# Patient Record
Sex: Female | Born: 1960 | Race: White | Hispanic: No | Smoking: Never smoker
Health system: Southern US, Community
[De-identification: ages and names within clinical notes are randomized; demographics above are authoritative.]

## PROBLEM LIST (undated history)

## (undated) DIAGNOSIS — F32A Depression, unspecified: Secondary | ICD-10-CM

## (undated) DIAGNOSIS — I10 Essential (primary) hypertension: Secondary | ICD-10-CM

## (undated) DIAGNOSIS — E079 Disorder of thyroid, unspecified: Secondary | ICD-10-CM

## (undated) DIAGNOSIS — F329 Major depressive disorder, single episode, unspecified: Secondary | ICD-10-CM

## (undated) HISTORY — PX: ABDOMINAL HYSTERECTOMY: SHX81

## (undated) HISTORY — PX: MENISCUS REPAIR: SHX5179

---

## 2015-01-01 ENCOUNTER — Emergency Department (HOSPITAL_COMMUNITY)
Admission: EM | Admit: 2015-01-01 | Discharge: 2015-01-01 | Disposition: A | Discharge status: Home / Self Care | Payer: Managed Care, Other (non HMO) | Attending: Emergency Medicine | Admitting: Emergency Medicine

## 2015-01-01 ENCOUNTER — Encounter (HOSPITAL_COMMUNITY): Payer: Self-pay | Admitting: Emergency Medicine

## 2015-01-01 ENCOUNTER — Emergency Department (HOSPITAL_COMMUNITY): Payer: Managed Care, Other (non HMO)

## 2015-01-01 DIAGNOSIS — I10 Essential (primary) hypertension: Secondary | ICD-10-CM | POA: Insufficient documentation

## 2015-01-01 DIAGNOSIS — F329 Major depressive disorder, single episode, unspecified: Secondary | ICD-10-CM | POA: Diagnosis not present

## 2015-01-01 DIAGNOSIS — Z79899 Other long term (current) drug therapy: Secondary | ICD-10-CM | POA: Diagnosis not present

## 2015-01-01 DIAGNOSIS — R002 Palpitations: Secondary | ICD-10-CM | POA: Diagnosis not present

## 2015-01-01 DIAGNOSIS — E079 Disorder of thyroid, unspecified: Secondary | ICD-10-CM | POA: Insufficient documentation

## 2015-01-01 HISTORY — DX: Depression, unspecified: F32.A

## 2015-01-01 HISTORY — DX: Disorder of thyroid, unspecified: E07.9

## 2015-01-01 HISTORY — DX: Essential (primary) hypertension: I10

## 2015-01-01 HISTORY — DX: Major depressive disorder, single episode, unspecified: F32.9

## 2015-01-01 LAB — CBC WITH DIFFERENTIAL/PLATELET
BASOS ABS: 0 10*3/uL (ref 0.0–0.1)
Basophils Relative: 1 % (ref 0–1)
EOS ABS: 0.4 10*3/uL (ref 0.0–0.7)
EOS PCT: 5 % (ref 0–5)
HCT: 37.6 % (ref 36.0–46.0)
HEMOGLOBIN: 12.4 g/dL (ref 12.0–15.0)
LYMPHS ABS: 1.8 10*3/uL (ref 0.7–4.0)
Lymphocytes Relative: 20 % (ref 12–46)
MCH: 28.7 pg (ref 26.0–34.0)
MCHC: 33 g/dL (ref 30.0–36.0)
MCV: 87 fL (ref 78.0–100.0)
Monocytes Absolute: 0.8 10*3/uL (ref 0.1–1.0)
Monocytes Relative: 9 % (ref 3–12)
Neutro Abs: 5.7 10*3/uL (ref 1.7–7.7)
Neutrophils Relative %: 65 % (ref 43–77)
PLATELETS: 341 10*3/uL (ref 150–400)
RBC: 4.32 MIL/uL (ref 3.87–5.11)
RDW: 13.5 % (ref 11.5–15.5)
WBC: 8.7 10*3/uL (ref 4.0–10.5)

## 2015-01-01 LAB — TROPONIN I
Troponin I: <0.03 ng/mL (ref <0.031)
Troponin I: <0.03 ng/mL (ref <0.031)

## 2015-01-01 LAB — COMPREHENSIVE METABOLIC PANEL
ALK PHOS: 78 U/L (ref 39–117)
ALT: 23 U/L (ref 0–35)
AST: 19 U/L (ref 0–37)
Albumin: 3.8 g/dL (ref 3.5–5.2)
Anion gap: 3 — ABNORMAL LOW (ref 5–15)
BUN: 14 mg/dL (ref 6–23)
CHLORIDE: 105 mmol/L (ref 96–112)
CO2: 31 mmol/L (ref 19–32)
Calcium: 9 mg/dL (ref 8.4–10.5)
Creatinine, Ser: 0.84 mg/dL (ref 0.50–1.10)
GFR calc non Af Amer: 78 mL/min — ABNORMAL LOW (ref >90)
GLUCOSE: 98 mg/dL (ref 70–99)
POTASSIUM: 3.6 mmol/L (ref 3.5–5.1)
Sodium: 139 mmol/L (ref 135–145)
Total Bilirubin: 0.5 mg/dL (ref 0.3–1.2)
Total Protein: 6.8 g/dL (ref 6.0–8.3)

## 2015-01-01 LAB — TSH: TSH: 1.779 u[IU]/mL (ref 0.350–4.500)

## 2015-01-01 NOTE — ED Notes (Signed)
[facility] with complain of palpitations, denies pain or sob. Abnormal stress test last year, Dr. Algis DownsAdvised if she had any further issues she may need a cardiac cath. 324 asa given prior to arival, alert, oriented, nad.

## 2015-01-01 NOTE — ED Provider Notes (Signed)
CSN: [id number]     Arrival date & time 01/01/15  1602 History   First MD Initiated Contact with Patient 01/01/15 1608     Chief Complaint  Patient presents with  . Palpitations     (Consider location/radiation/quality/duration/timing/severity/associated sxs/prior Treatment) HPI.... Sense of palpitations intermittently for a couple days. Fleeting central chest discomfort described as a "ping", lasting approximately 2 seconds. No dyspnea, diaphoresis, nausea. Nonsmoker. No family history of myocardial infarction. Patient is Designer, jewelleryregistered nurse. Symptoms appear to be worse at night. She describes approximately 50 palpitations per day. [place]  Past Medical History  Diagnosis Date  . Hypertension   . Depression   . Thyroid disease    Past Surgical History  Procedure Laterality Date  . Meniscus repair    . Abdominal hysterectomy     No family history on file. History  Substance Use Topics  . Smoking status: Never Smoker   . Smokeless tobacco: Not on file  . Alcohol Use: No   OB History    No data available     Review of Systems  All other systems reviewed and are negative.     Allergies  Review of patient's allergies indicates no known allergies.  Home Medications   Prior to Admission medications   Medication Sig Start Date End Date Taking? Authorizing Provider  ARIPiprazole (ABILIFY) 5 MG tablet Take 5 mg by mouth daily.   Yes Historical Provider, MD  atorvastatin (LIPITOR) 40 MG tablet Take 40 mg by mouth daily.   Yes Historical Provider, MD  escitalopram (LEXAPRO) 10 MG tablet Take 10 mg by mouth daily.   Yes Historical Provider, MD  hydrochlorothiazide (HYDRODIURIL) 25 MG tablet Take 25 mg by mouth daily.   Yes Historical Provider, MD  levothyroxine (SYNTHROID, LEVOTHROID) 25 MCG tablet Take 25 mcg by mouth daily before breakfast.   Yes Historical Provider, MD   BP 126/67 mmHg  Pulse 70  Temp(Src) 98.3 F (36.8 C) (Oral)   Resp 20  Ht 5' (1.524 m)  Wt 180 lb (81.647 kg)  BMI 35.15 kg/m2  SpO2 99% Physical Exam  Constitutional: She is oriented to person, place, and time. She appears well-developed and well-nourished.  HENT:  Head: Normocephalic and atraumatic.  Eyes: Conjunctivae and EOM are normal. Pupils are equal, round, and reactive to light.  Neck: Normal range of motion. Neck supple.  Cardiovascular: Normal rate.   PVC's noted on monitor. No ventricular tachycardia  Pulmonary/Chest: Effort normal and breath sounds normal.  Abdominal: Soft. Bowel sounds are normal.  Musculoskeletal: Normal range of motion.  Neurological: She is alert and oriented to person, place, and time.  Skin: Skin is warm and dry.  Psychiatric: She has a normal mood and affect. Her behavior is normal.  Nursing note and vitals reviewed.   ED Course  Procedures (including critical care time) Labs Review Labs Reviewed  COMPREHENSIVE METABOLIC PANEL - Abnormal; Notable for the following:    GFR calc non Af Amer 78 (*)    Anion gap 3 (*)    All other components within normal limits  CBC WITH DIFFERENTIAL/PLATELET  TROPONIN I  TSH  TROPONIN I    Imaging Review Dg Chest Port 1 View  01/01/2015   CLINICAL DATA:  Hypertension and arrhythmias  EXAM: PORTABLE CHEST - 1 VIEW  COMPARISON:  None.  FINDINGS: The heart size and mediastinal contours are within normal limits. Both lungs are clear. The visualized skeletal structures are unremarkable.  IMPRESSION: No active disease.   Electronically Signed   By: Alcide Clever M.D.   On: 01/01/2015 17:17     EKG Interpretation   Date/Time:  Wednesday January 01 2015 16:27:57 EST Ventricular Rate:  62 PR Interval:  151 QRS Duration: 96 QT Interval:  450 QTC Calculation: 457 R Axis:   25 Text Interpretation:  Sinus arrhythmia Probable left atrial enlargement  Consider anterior infarct Confirmed by Jacayla Nordell  MD, Rex Oesterle ([id number]) on  01/01/2015 4:37:17 PM      MDM   Final diagnoses:   Palpitation    Patient is hemodynamically stable. Monitor shows occasional PVCs. Discussed with cardiologist on-call. We both agree patient can follow-up with her cardiologist at [facility]. TSH pending. This was all discussed with the patient.    Donnetta Hutching, MD 01/01/15 [phone]

## 2015-01-01 NOTE — Discharge Instructions (Signed)
I discussed your situation with the cardiologist on-call. It is appropriate to follow up with your cardiologist at home. Thyroid test pending. Can call back for test results.

## 2016-01-04 IMAGING — CR DG CHEST 1V PORT
1 series · 1 of 1 positions shown · non-contrast
Comparison: None.

CLINICAL DATA: Hypertension and arrhythmias

EXAM:
PORTABLE CHEST - 1 VIEW

[AP]
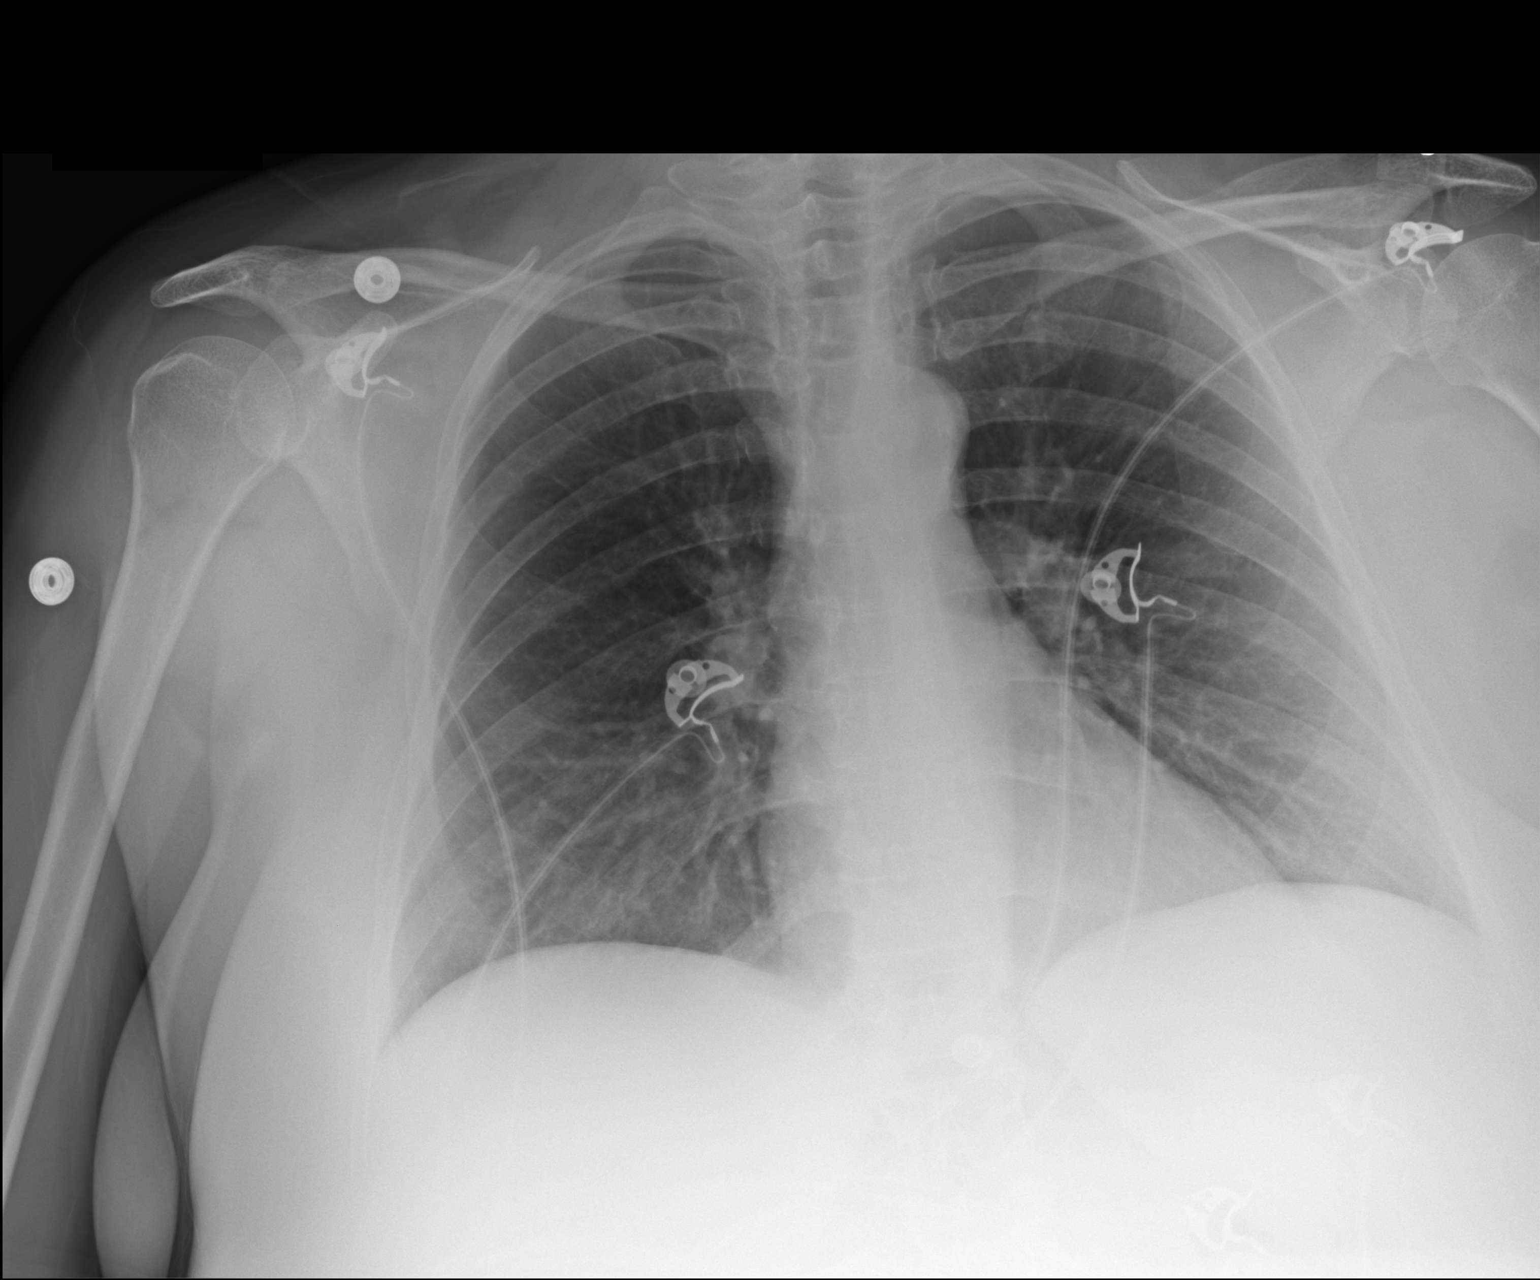

[1 of 1 positions shown; findings below may reference images not displayed]

FINDINGS: The heart size and mediastinal contours are within normal limits.
Both lungs are clear. The visualized skeletal structures are
unremarkable.
IMPRESSION: No active disease.
